# Patient Record
Sex: Female | Born: 2000 | Race: Black or African American | Hispanic: No | Marital: Single | Smoking: Never smoker
Health system: Southern US, Community
[De-identification: ages and names within clinical notes are randomized; demographics above are authoritative.]

---

## 2021-04-06 ENCOUNTER — Ambulatory Visit (HOSPITAL_COMMUNITY): Admission: EM | Admit: 2021-04-06 | Discharge: 2021-04-06 | Payer: BC Managed Care – PPO

## 2021-04-06 ENCOUNTER — Encounter (HOSPITAL_COMMUNITY): Payer: Self-pay | Admitting: Emergency Medicine

## 2021-04-06 ENCOUNTER — Other Ambulatory Visit: Payer: Self-pay

## 2021-04-06 ENCOUNTER — Emergency Department (HOSPITAL_COMMUNITY)
Admission: EM | Admit: 2021-04-06 | Discharge: 2021-04-07 | Disposition: A | Discharge status: Home / Self Care | Payer: BC Managed Care – PPO | Attending: Emergency Medicine | Admitting: Emergency Medicine

## 2021-04-06 DIAGNOSIS — E871 Hypo-osmolality and hyponatremia: Secondary | ICD-10-CM | POA: Insufficient documentation

## 2021-04-06 DIAGNOSIS — L02412 Cutaneous abscess of left axilla: Secondary | ICD-10-CM | POA: Diagnosis not present

## 2021-04-06 DIAGNOSIS — L02419 Cutaneous abscess of limb, unspecified: Secondary | ICD-10-CM | POA: Diagnosis not present

## 2021-04-06 DIAGNOSIS — D72829 Elevated white blood cell count, unspecified: Secondary | ICD-10-CM | POA: Insufficient documentation

## 2021-04-06 DIAGNOSIS — R000 Tachycardia, unspecified: Secondary | ICD-10-CM | POA: Insufficient documentation

## 2021-04-06 LAB — PREGNANCY, URINE: Preg Test, Ur: NEGATIVE

## 2021-04-06 MED ORDER — ACETAMINOPHEN 325 MG PO TABS
650.0000 mg | ORAL_TABLET | Freq: Once | ORAL | Status: AC
  Administered 2021-04-06: 650 mg via ORAL

## 2021-04-06 MED ORDER — ACETAMINOPHEN 325 MG PO TABS
ORAL_TABLET | ORAL | Status: AC
  Filled 2021-04-06: qty 2

## 2021-04-06 NOTE — Discharge Instructions (Addendum)
Recommend evaluation in the ED for further workup, fever, tachycardia, abscess

## 2021-04-06 NOTE — ED Triage Notes (Signed)
C/o abscess to L axilla x 1 week with fever.  Seen at [facility] and sent to ED.

## 2021-04-06 NOTE — ED Notes (Signed)
Patient is being discharged from the Urgent Care and sent to the Emergency Department via personal vehicle . Per provider Jodell Cipro, patient is in need of higher level of care due to fever & abscess (infection0. Patient is aware and verbalizes understanding of plan of care.   Vitals:   04/06/21 1531  BP: 124/78  Pulse: (!) 118  Resp: 19  Temp: (!) 101.4 F (38.6 C)  SpO2: 98%

## 2021-04-06 NOTE — ED Provider Notes (Addendum)
[facility]    CSN: [id number] Arrival date & time: 04/06/21  1501      History   Chief Complaint Chief Complaint  Patient presents with  . Cyst    HPI Jenna Chan is a 20 y.o. female.   Pt complains of a painful cyst to her armpit that started about 4 days ago.  Reports previous cyst to this area in the past that resolved on it's own.  She denies fevers at home.  She has tried warm compress with minimal improvement.      History reviewed. No pertinent past medical history.  There are no problems to display for this patient.   History reviewed. No pertinent surgical history.  OB History   No obstetric history on file.      Home Medications    Prior to Admission medications   Medication Sig Start Date End Date Taking? Authorizing Provider  JUNEL FE 1/20 1-20 MG-MCG tablet Take 1 tablet by mouth daily. 01/20/21   [provider]    Family History No family history on file.  Social History Social History   Tobacco Use  . Smoking status: Never Smoker  . Smokeless tobacco: Never Used  Vaping Use  . Vaping Use: Never used  Substance Use Topics  . Alcohol use: Yes    Comment: rarely  . Drug use: Never     Allergies   Patient has no known allergies.   Review of Systems Review of Systems  Constitutional: Positive for chills and fever.  HENT: Negative for ear pain and sore throat.   Eyes: Negative for pain and visual disturbance.  Respiratory: Negative for cough and shortness of breath.   Cardiovascular: Negative for chest pain and palpitations.  Gastrointestinal: Negative for abdominal pain and vomiting.  Genitourinary: Negative for dysuria and hematuria.  Musculoskeletal: Negative for arthralgias and back pain.  Skin: Negative for color change and rash.       cyst  Neurological: Negative for seizures and syncope.  All other systems reviewed and are negative.    Physical Exam Triage Vital Signs ED Triage Vitals  Enc  Vitals Group     BP 04/06/21 1531 124/78     Pulse Rate 04/06/21 1531 (!) 118     Resp 04/06/21 1531 19     Temp 04/06/21 1531 (!) 101.4 F (38.6 C)     Temp Source 04/06/21 1531 Oral     SpO2 04/06/21 1531 98 %     Weight --      Height --      Head Circumference --      Peak Flow --      Pain Score 04/06/21 1528 6     Pain Loc --      Pain Edu? --      Excl. in GC? --    No data found.  Updated Vital Signs BP 124/78 (BP Location: Left Arm)   Pulse (!) 118   Temp (!) 101.4 F (38.6 C) (Oral)   Resp 19   SpO2 98%   Visual Acuity Right Eye Distance:   Left Eye Distance:   Bilateral Distance:    Right Eye Near:   Left Eye Near:    Bilateral Near:     Physical Exam Vitals and nursing note reviewed.  Constitutional:      General: She is not in acute distress.    Appearance: She is well-developed.  HENT:     Head: Normocephalic and atraumatic.  Eyes:  Conjunctiva/sclera: Conjunctivae normal.  Cardiovascular:     Rate and Rhythm: Normal rate and regular rhythm.     Heart sounds: No murmur heard.   Pulmonary:     Effort: Pulmonary effort is normal. No respiratory distress.     Breath sounds: Normal breath sounds.  Abdominal:     Palpations: Abdomen is soft.     Tenderness: There is no abdominal tenderness.  Musculoskeletal:     Cervical back: Neck supple.  Skin:    General: Skin is warm and dry.     Comments: Pt has difficulty moving the arm due to pain.  Fluctuant abscess with surrounding erythema, warmth. NO drainage noted.   Neurological:     Mental Status: She is alert.      UC Treatments / Results  Labs (all labs ordered are listed, but only abnormal results are displayed) Labs Reviewed - No data to display  EKG   Radiology No results found.  Procedures Procedures (including critical care time)  Medications Ordered in UC Medications  acetaminophen (TYLENOL) tablet 650 mg (650 mg Oral Given 04/06/21 1536)    Initial Impression /  Assessment and Plan / UC Course  I have reviewed the triage vital signs and the nursing notes.  Pertinent labs & imaging results that were available during my care of the patient were reviewed by me and considered in my medical decision making (see chart for details).     Pt febrile, tachycardia with left axilla abscess.  Recommend further workup including CBC, sepsis workup.  Advised to go to ED.  Final Clinical Impressions(s) / UC Diagnoses   Final diagnoses:  None     Discharge Instructions     Recommend evaluation in the ED for further workup, fever, tachycardia, abscess   ED Prescriptions    None     PDMP not reviewed this encounter.   Jodell Cipro, PA-C 04/06/21 1629    Jodell Cipro, PA-C 05/02/21 1540

## 2021-04-06 NOTE — ED Triage Notes (Signed)
Emergency Medicine Provider Triage Evaluation Note  Jenna Chan , a 20 y.o. female  was evaluated in triage.  Pt complains of left axillary abscess for the past 10 days.  No documented fevers at home.  Review of Systems  Positive: Abscess Negative: numbness  Physical Exam  BP 117/74 (BP Location: Left Arm)   Pulse (!) 108   Temp (!) 100.4 F (38 C) (Oral)   Resp 16   SpO2 98%  Gen:   Awake, no distress   HEENT:  Atraumatic  Resp:  Normal effort  Cardiac:  Normal rate  Abd:   Nondistended, nontender  MSK:   Moves extremities without difficulty, L axillary abscess, tender to touch Neuro:  Speech clear   Medical Decision Making  Medically screening exam initiated at 5:10 PM.  Appropriate orders placed.  Lanell Rawlinson was informed that the remainder of the evaluation will be completed by another provider, this initial triage assessment does not replace that evaluation, and the importance of remaining in the ED until their evaluation is complete.  Clinical Impression  Patient given Tylenol for fever in urgent care.  Will obtain urine pregnancy test potential abx therapy.   Dietrich Pates, PA-C 04/06/21 1711

## 2021-04-06 NOTE — ED Triage Notes (Signed)
Pt present today with a growth under her left armpit. Pt states that she noticed the growth last Wednesday.

## 2021-04-06 NOTE — ED Provider Notes (Incomplete Revision)
[facility]    CSN: [id number] Arrival date & time: 04/06/21  1501      History   Chief Complaint Chief Complaint  Patient presents with  . Cyst    HPI Cerissa Granados is a 20 y.o. female.   Pt complains of a painful cyst to her armpit that started about 4 days ago.  Reports previous cyst to this area in the past that resolved on it's own.  She denies fevers at home.  She has tried warm compress with minimal improvement.      History reviewed. No pertinent past medical history.  There are no problems to display for this patient.   History reviewed. No pertinent surgical history.  OB History   No obstetric history on file.      Home Medications    Prior to Admission medications   Medication Sig Start Date End Date Taking? Authorizing Provider  JUNEL FE 1/20 1-20 MG-MCG tablet Take 1 tablet by mouth daily. 01/20/21   [provider]    Family History No family history on file.  Social History Social History   Tobacco Use  . Smoking status: Never Smoker  . Smokeless tobacco: Never Used  Vaping Use  . Vaping Use: Never used  Substance Use Topics  . Alcohol use: Yes    Comment: rarely  . Drug use: Never     Allergies   Patient has no known allergies.   Review of Systems Review of Systems  Constitutional: Positive for chills and fever.  HENT: Negative for ear pain and sore throat.   Eyes: Negative for pain and visual disturbance.  Respiratory: Negative for cough and shortness of breath.   Cardiovascular: Negative for chest pain and palpitations.  Gastrointestinal: Negative for abdominal pain and vomiting.  Genitourinary: Negative for dysuria and hematuria.  Musculoskeletal: Negative for arthralgias and back pain.  Skin: Negative for color change and rash.       cyst  Neurological: Negative for seizures and syncope.  All other systems reviewed and are negative.    Physical Exam Triage Vital Signs ED Triage Vitals  Enc  Vitals Group     BP 04/06/21 1531 124/78     Pulse Rate 04/06/21 1531 (!) 118     Resp 04/06/21 1531 19     Temp 04/06/21 1531 (!) 101.4 F (38.6 C)     Temp Source 04/06/21 1531 Oral     SpO2 04/06/21 1531 98 %     Weight --      Height --      Head Circumference --      Peak Flow --      Pain Score 04/06/21 1528 6     Pain Loc --      Pain Edu? --      Excl. in GC? --    No data found.  Updated Vital Signs BP 124/78 (BP Location: Left Arm)   Pulse (!) 118   Temp (!) 101.4 F (38.6 C) (Oral)   Resp 19   SpO2 98%   Visual Acuity Right Eye Distance:   Left Eye Distance:   Bilateral Distance:    Right Eye Near:   Left Eye Near:    Bilateral Near:     Physical Exam Vitals and nursing note reviewed.  Constitutional:      General: She is not in acute distress.    Appearance: She is well-developed.  HENT:     Head: Normocephalic and atraumatic.  Eyes:  Conjunctiva/sclera: Conjunctivae normal.  Cardiovascular:     Rate and Rhythm: Normal rate and regular rhythm.     Heart sounds: No murmur heard.   Pulmonary:     Effort: Pulmonary effort is normal. No respiratory distress.     Breath sounds: Normal breath sounds.  Abdominal:     Palpations: Abdomen is soft.     Tenderness: There is no abdominal tenderness.  Musculoskeletal:     Cervical back: Neck supple.  Skin:    General: Skin is warm and dry.     Comments: Pt has difficulty moving the arm due to pain.  Fluctuant abscess with surrounding erythema, warmth. NO drainage noted.   Neurological:     Mental Status: She is alert.      UC Treatments / Results  Labs (all labs ordered are listed, but only abnormal results are displayed) Labs Reviewed - No data to display  EKG   Radiology No results found.  Procedures Procedures (including critical care time)  Medications Ordered in UC Medications  acetaminophen (TYLENOL) tablet 650 mg (650 mg Oral Given 04/06/21 1536)    Initial Impression /  Assessment and Plan / UC Course  I have reviewed the triage vital signs and the nursing notes.  Pertinent labs & imaging results that were available during my care of the patient were reviewed by me and considered in my medical decision making (see chart for details).     Pt febrile, tachycardia with left axilla abscess.  Recommend further workup including CBC, sepsis workup.  Advised to go to ED.  Final Clinical Impressions(s) / UC Diagnoses   Final diagnoses:  None     Discharge Instructions     Recommend evaluation in the ED for further workup, fever, tachycardia, abscess   ED Prescriptions    None     PDMP not reviewed this encounter.   Jodell Cipro, PA-C 04/06/21 1629

## 2021-04-07 ENCOUNTER — Emergency Department (HOSPITAL_COMMUNITY): Payer: BC Managed Care – PPO

## 2021-04-07 DIAGNOSIS — L02412 Cutaneous abscess of left axilla: Secondary | ICD-10-CM | POA: Diagnosis not present

## 2021-04-07 LAB — CBC WITH DIFFERENTIAL/PLATELET
Abs Immature Granulocytes: 0.07 10*3/uL (ref 0.00–0.07)
Basophils Absolute: 0 10*3/uL (ref 0.0–0.1)
Basophils Relative: 0 %
Eosinophils Absolute: 0.1 10*3/uL (ref 0.0–0.5)
Eosinophils Relative: 1 %
HCT: 35.6 % — ABNORMAL LOW (ref 36.0–46.0)
Hemoglobin: 11.9 g/dL — ABNORMAL LOW (ref 12.0–15.0)
Immature Granulocytes: 0 %
Lymphocytes Relative: 14 %
Lymphs Abs: 2.3 10*3/uL (ref 0.7–4.0)
MCH: 28.1 pg (ref 26.0–34.0)
MCHC: 33.4 g/dL (ref 30.0–36.0)
MCV: 84.2 fL (ref 80.0–100.0)
Monocytes Absolute: 1.2 10*3/uL — ABNORMAL HIGH (ref 0.1–1.0)
Monocytes Relative: 8 %
Neutro Abs: 12.3 10*3/uL — ABNORMAL HIGH (ref 1.7–7.7)
Neutrophils Relative %: 77 %
Platelets: 472 10*3/uL — ABNORMAL HIGH (ref 150–400)
RBC: 4.23 MIL/uL (ref 3.87–5.11)
RDW: 13.2 % (ref 11.5–15.5)
WBC: 16 10*3/uL — ABNORMAL HIGH (ref 4.0–10.5)
nRBC: 0 % (ref 0.0–0.2)

## 2021-04-07 LAB — BASIC METABOLIC PANEL
Anion gap: 6 (ref 5–15)
BUN: 6 mg/dL (ref 6–20)
CO2: 27 mmol/L (ref 22–32)
Calcium: 8.6 mg/dL — ABNORMAL LOW (ref 8.9–10.3)
Chloride: 100 mmol/L (ref 98–111)
Creatinine, Ser: 0.78 mg/dL (ref 0.44–1.00)
GFR, Estimated: >60 mL/min (ref >60)
Glucose, Bld: 121 mg/dL — ABNORMAL HIGH (ref 70–99)
Potassium: 4.6 mmol/L (ref 3.5–5.1)
Sodium: 133 mmol/L — ABNORMAL LOW (ref 135–145)

## 2021-04-07 LAB — URINALYSIS, ROUTINE W REFLEX MICROSCOPIC
Bacteria, UA: NONE SEEN
Bilirubin Urine: NEGATIVE
Glucose, UA: NEGATIVE mg/dL
Ketones, ur: NEGATIVE mg/dL
Leukocytes,Ua: NEGATIVE
Nitrite: NEGATIVE
Protein, ur: 30 mg/dL — AB
Specific Gravity, Urine: 1.025 (ref 1.005–1.030)
pH: 5 (ref 5.0–8.0)

## 2021-04-07 LAB — APTT: aPTT: 28 seconds (ref 24–36)

## 2021-04-07 LAB — PROTIME-INR
INR: 1 (ref 0.8–1.2)
Prothrombin Time: 12.8 seconds (ref 11.4–15.2)

## 2021-04-07 LAB — LACTIC ACID, PLASMA: Lactic Acid, Venous: 0.9 mmol/L (ref 0.5–1.9)

## 2021-04-07 MED ORDER — LIDOCAINE-EPINEPHRINE (PF) 2 %-1:200000 IJ SOLN
10.0000 mL | Freq: Once | INTRAMUSCULAR | Status: AC
  Administered 2021-04-07: 10 mL
  Filled 2021-04-07: qty 20

## 2021-04-07 MED ORDER — DOXYCYCLINE HYCLATE 100 MG PO CAPS: 100.0000 mg | ORAL_CAPSULE | Freq: Two times a day (BID) | ORAL | 0 refills | Status: AC

## 2021-04-07 MED ORDER — ACETAMINOPHEN 325 MG PO TABS
650.0000 mg | ORAL_TABLET | Freq: Once | ORAL | Status: AC
  Administered 2021-04-07: 650 mg via ORAL
  Filled 2021-04-07: qty 2

## 2021-04-07 MED ORDER — LACTATED RINGERS IV BOLUS (SEPSIS)
1000.0000 mL | Freq: Once | INTRAVENOUS | Status: AC
  Administered 2021-04-07: 1000 mL via INTRAVENOUS

## 2021-04-07 MED ORDER — DOXYCYCLINE HYCLATE 100 MG PO TABS
100.0000 mg | ORAL_TABLET | Freq: Once | ORAL | Status: AC
  Administered 2021-04-07: 100 mg via ORAL
  Filled 2021-04-07: qty 1

## 2021-04-07 NOTE — ED Provider Notes (Signed)
[facility] EMERGENCY DEPARTMENT Provider Note   CSN: [id number] Arrival date & time: 04/06/21  1618   History Chief Complaint  Patient presents with  . Abscess    Jenna Chan is a 20 y.o. female.  The history is provided by the patient.  Abscess He has no significant medical history and comes in complaining of infection in her left axilla which started about 10 days ago.  It has been getting gradually larger and more painful.  She currently rates pain at 8/10.  She has not noted any fever but has had some chills and sweats.  She went to urgent care earlier today and was referred here because of fever.  She has had similar lumps come up in the past, but they resolved on their own.  She has not done anything to treat her symptoms.  History reviewed. No pertinent past medical history.  There are no problems to display for this patient.   History reviewed. No pertinent surgical history.   OB History   No obstetric history on file.     No family history on file.  Social History   Tobacco Use  . Smoking status: Never Smoker  . Smokeless tobacco: Never Used  Vaping Use  . Vaping Use: Never used  Substance Use Topics  . Alcohol use: Yes    Comment: rarely  . Drug use: Never    Home Medications Prior to Admission medications   Medication Sig Start Date End Date Taking? Authorizing Provider  JUNEL FE 1/20 1-20 MG-MCG tablet Take 1 tablet by mouth daily. 01/20/21   [provider]    Allergies    Patient has no known allergies.  Review of Systems   Review of Systems  All other systems reviewed and are negative.   Physical Exam Updated Vital Signs BP 111/72 (BP Location: Right Arm)   Pulse (!) 122   Temp (!) 101.3 F (38.5 C)   Resp 18   SpO2 100%   Physical Exam Vitals and nursing note reviewed.   20 year old female, resting comfortably and in no acute distress. Vital signs are significant for elevated temperature and heart rate.  Oxygen saturation is 100%, which is normal. Head is normocephalic and atraumatic. PERRLA, EOMI. Oropharynx is clear. Neck is nontender and supple without adenopathy or JVD. Back is nontender and there is no CVA tenderness. Lungs are clear without rales, wheezes, or rhonchi. Chest is nontender. Heart is tachycardic without murmur. Abdomen is soft, flat, nontender without masses or hepatosplenomegaly and peristalsis is normoactive. Extremities: Fluctuant, tender area noted in the left axilla consistent with abscess.  No overlying skin erythema.  Range of motion of left shoulder restricted by pain.  Remainder of extremity exam is normal. Skin is warm and dry without rash. Neurologic: Mental status is normal, cranial nerves are intact, there are no motor or sensory deficits.  ED Results / Procedures / Treatments   Labs (all labs ordered are listed, but only abnormal results are displayed) Labs Reviewed  BASIC METABOLIC PANEL - Abnormal; Notable for the following components:      Result Value   Sodium 133 (*)    Glucose, Bld 121 (*)    Calcium 8.6 (*)    All other components within normal limits  CBC WITH DIFFERENTIAL/PLATELET - Abnormal; Notable for the following components:   WBC 16.0 (*)    Hemoglobin 11.9 (*)    HCT 35.6 (*)    Platelets 472 (*)  Neutro Abs 12.3 (*)    Monocytes Absolute 1.2 (*)    All other components within normal limits  CULTURE, BLOOD (SINGLE)  URINE CULTURE  PREGNANCY, URINE  LACTIC ACID, PLASMA  LACTIC ACID, PLASMA  PROTIME-INR  APTT  URINALYSIS, ROUTINE W REFLEX MICROSCOPIC    EKG None  Radiology No results found.  Procedures .Marland KitchenIncision and Drainage  Date/Time: 04/07/2021 2:55 AM Performed by: Dione Booze, MD Authorized by: Dione Booze, MD   Consent:    Consent obtained:  Verbal   Consent given by:  Patient   Risks discussed:  Bleeding, incomplete drainage and pain   Alternatives discussed:  Alternative treatment Universal protocol:     Procedure explained and questions answered to patient or proxy's satisfaction: yes     Relevant documents present and verified: yes     Test results available : yes     Required blood products, implants, devices, and special equipment available: yes     Site/side marked: yes     Immediately prior to procedure, a time out was called: yes     Patient identity confirmed:  Verbally with patient and arm band Location:    Type:  Abscess   Size:  4 cm   Location:  Upper extremity (left axilla)   Upper extremity location: left axilla. Pre-procedure details:    Skin preparation:  Chlorhexidine Sedation:    Sedation type:  None Anesthesia:    Anesthesia method:  Local infiltration   Local anesthetic:  Lidocaine 2% WITH epi Procedure type:    Complexity:  Complex Procedure details:    Ultrasound guidance: no     Needle aspiration: no     Incision types:  Cruciate   Incision depth:  Subcutaneous   Wound management:  Probed and deloculated   Drainage:  Purulent   Drainage amount:  Copious   Wound treatment:  Wound left open   Packing materials:  None Post-procedure details:    Procedure completion:  Tolerated well, no immediate complications     Medications Ordered in ED Medications  doxycycline (VIBRA-TABS) tablet 100 mg (has no administration in time range)  acetaminophen (TYLENOL) tablet 650 mg (650 mg Oral Given 04/07/21 0025)  lactated ringers bolus 1,000 mL (0 mLs Intravenous Stopped 04/07/21 0256)  lidocaine-EPINEPHrine (XYLOCAINE W/EPI) 2 %-1:200000 (PF) injection 10 mL (10 mLs Infiltration Given 04/07/21 0240)    ED Course  I have reviewed the triage vital signs and the nursing notes.  Pertinent labs & imaging results that were available during my care of the patient were reviewed by me and considered in my medical decision making (see chart for details).  MDM Rules/Calculators/A&P Abscess of the left axilla.  History of prior subclinical abscesses is concerning for  development of hidradenitis suppurativa.  Currently, she is febrile and tachycardic but does not appear overtly septic.  Will check sepsis labs and abscess will need incision and drainage.  Old records were reviewed, and she has no relevant past visits.  I&D yielded a large amount of purulent material which was not malodorous.  WBC is moderately elevated but with no left shift.  Mild hyponatremia is present which is not felt to be clinically significant.  Urinalysis shows no evidence of infection.  Lactic acid level is normal.  Temperatures come down with acetaminophen and heart rate has come down significantly.  Patient continues to look nontoxic and I feel that she can adequately be treated as an outpatient.  She is discharged with prescription for doxycycline, advised to  have recheck done in 2-3 days.  Final Clinical Impression(s) / ED Diagnoses Final diagnoses:  Abscess of left axilla    Rx / DC Orders ED Discharge Orders         Ordered    doxycycline (VIBRAMYCIN) 100 MG capsule  2 times daily        04/07/21 0510           Dione Booze, MD 04/07/21 [phone]

## 2021-04-07 NOTE — Discharge Instructions (Signed)
Apply warm compresses several times a day.  Take acetaminophen and/or ibuprofen as needed for fever or pain.  Please have your abscess rechecked in 2-3 days.  This can be done at student [facility] on campus, or at an urgent care center.  Return to the emergency department if you are having any problems.

## 2021-04-08 LAB — URINE CULTURE

## 2021-04-12 LAB — CULTURE, BLOOD (SINGLE): Culture: NO GROWTH

## 2021-10-02 IMAGING — DX DG CHEST 1V PORT
1 series · 1 of 1 positions shown · non-contrast
Comparison: None.

CLINICAL DATA: Questionable sepsis

EXAM:
PORTABLE CHEST 1 VIEW

[chest ap]
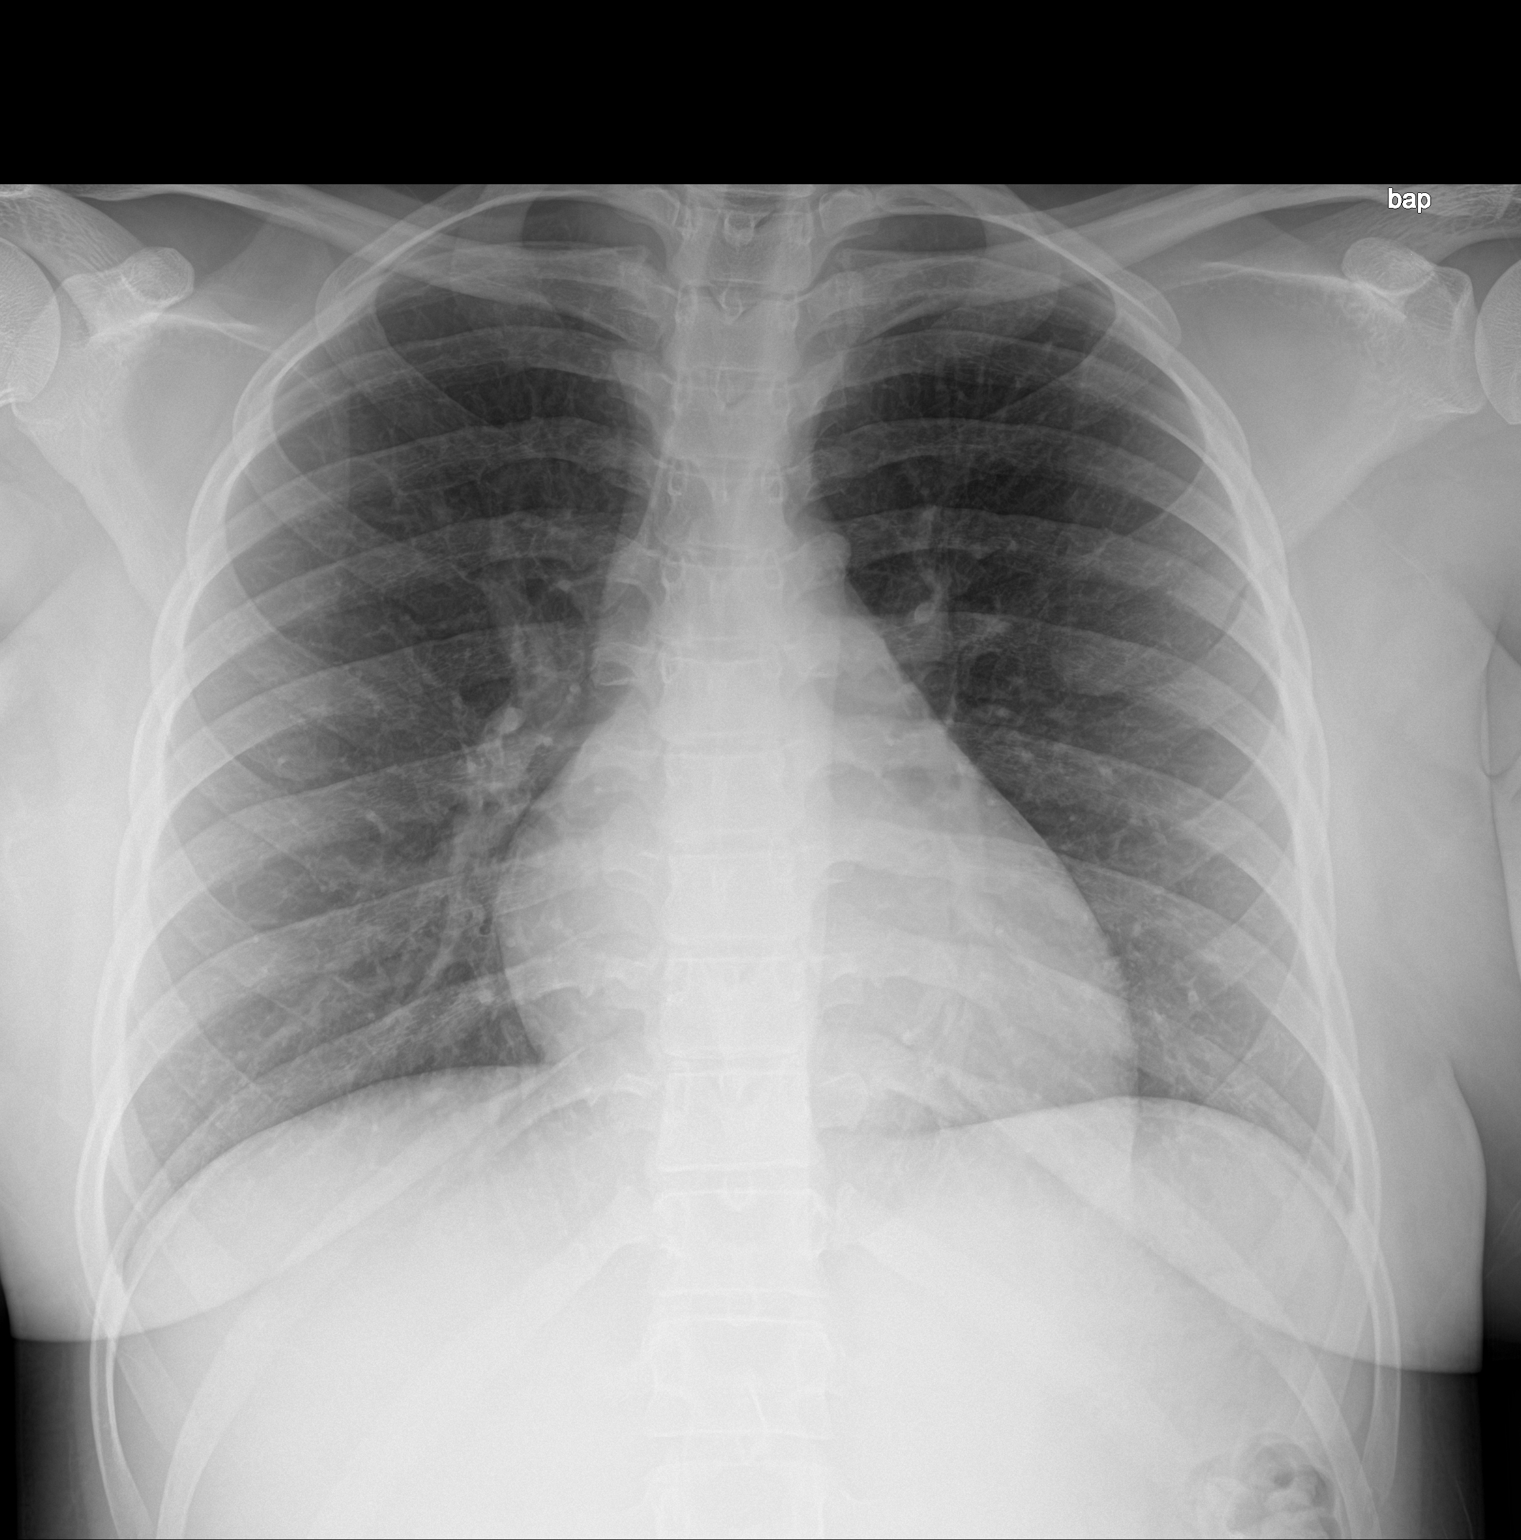

[1 of 1 positions shown; findings below may reference images not displayed]

FINDINGS: The heart size and mediastinal contours are within normal limits.
Both lungs are clear. The visualized skeletal structures are
unremarkable.
IMPRESSION: No active disease.

## 2021-12-25 ENCOUNTER — Ambulatory Visit (HOSPITAL_COMMUNITY): Payer: PRIVATE HEALTH INSURANCE
